# Patient Record
Sex: Male | Born: 1960 | Race: White | Hispanic: No | Marital: Married | Smoking: Never smoker
Health system: Southern US, Community
[De-identification: ages and names within clinical notes are randomized; demographics above are authoritative.]

## PROBLEM LIST (undated history)

## (undated) DIAGNOSIS — I10 Essential (primary) hypertension: Secondary | ICD-10-CM

## (undated) DIAGNOSIS — I456 Pre-excitation syndrome: Secondary | ICD-10-CM

---

## 2006-01-14 ENCOUNTER — Emergency Department (HOSPITAL_COMMUNITY): Admission: EM | Admit: 2006-01-14 | Discharge: 2006-01-14 | Payer: Self-pay | Admitting: Emergency Medicine

## 2007-11-14 ENCOUNTER — Encounter: Admission: RE | Admit: 2007-11-14 | Discharge: 2007-11-14 | Payer: Self-pay | Admitting: Orthopedic Surgery

## 2008-07-11 ENCOUNTER — Ambulatory Visit (HOSPITAL_BASED_OUTPATIENT_CLINIC_OR_DEPARTMENT_OTHER): Admission: RE | Admit: 2008-07-11 | Discharge: 2008-07-11 | Payer: Self-pay | Admitting: Family Medicine

## 2008-07-11 ENCOUNTER — Ambulatory Visit: Payer: Self-pay | Admitting: Diagnostic Radiology

## 2009-09-15 ENCOUNTER — Emergency Department (HOSPITAL_COMMUNITY): Admission: EM | Admit: 2009-09-15 | Discharge: 2009-09-15 | Payer: Self-pay | Admitting: Emergency Medicine

## 2009-09-18 ENCOUNTER — Emergency Department (HOSPITAL_COMMUNITY): Admission: EM | Admit: 2009-09-18 | Discharge: 2009-09-18 | Payer: Self-pay | Admitting: Emergency Medicine

## 2009-10-14 ENCOUNTER — Encounter: Admission: RE | Admit: 2009-10-14 | Discharge: 2009-10-14 | Payer: Self-pay | Admitting: Sports Medicine

## 2010-09-26 LAB — URINALYSIS, ROUTINE W REFLEX MICROSCOPIC
Bilirubin Urine: NEGATIVE
Hgb urine dipstick: NEGATIVE
Ketones, ur: NEGATIVE mg/dL
Protein, ur: NEGATIVE mg/dL
Specific Gravity, Urine: 1.027 (ref 1.005–1.030)

## 2010-09-26 LAB — DIFFERENTIAL
Basophils Relative: 1 % (ref 0–1)
Basophils Relative: 1 % (ref 0–1)
Eosinophils Relative: 3 % (ref 0–5)
Lymphocytes Relative: 37 % (ref 12–46)
Lymphs Abs: 1.9 10*3/uL (ref 0.7–4.0)
Monocytes Absolute: 0.3 10*3/uL (ref 0.1–1.0)
Monocytes Absolute: 0.3 10*3/uL (ref 0.1–1.0)
Monocytes Relative: 6 % (ref 3–12)
Neutro Abs: 2.7 10*3/uL (ref 1.7–7.7)
Neutrophils Relative %: 56 % (ref 43–77)

## 2010-09-26 LAB — BASIC METABOLIC PANEL
BUN: 16 mg/dL (ref 6–23)
BUN: 18 mg/dL (ref 6–23)
CO2: 25 mEq/L (ref 19–32)
CO2: 28 mEq/L (ref 19–32)
Calcium: 9.2 mg/dL (ref 8.4–10.5)
Calcium: 9.6 mg/dL (ref 8.4–10.5)
Chloride: 102 mEq/L (ref 96–112)
Creatinine, Ser: 0.89 mg/dL (ref 0.4–1.5)
Creatinine, Ser: 1.06 mg/dL (ref 0.4–1.5)
GFR calc Af Amer: >60 mL/min (ref >60)
GFR calc non Af Amer: >60 mL/min (ref >60)
Glucose, Bld: 117 mg/dL — ABNORMAL HIGH (ref 70–99)
Glucose, Bld: 126 mg/dL — ABNORMAL HIGH (ref 70–99)
Sodium: 135 mEq/L (ref 135–145)

## 2010-09-26 LAB — CBC
HCT: 47.5 % (ref 39.0–52.0)
MCHC: 32.4 g/dL (ref 30.0–36.0)
MCHC: 33.2 g/dL (ref 30.0–36.0)
MCV: 83.4 fL (ref 78.0–100.0)
Platelets: 207 10*3/uL (ref 150–400)
RBC: 5.69 MIL/uL (ref 4.22–5.81)
RDW: 14.3 % (ref 11.5–15.5)
RDW: 14.4 % (ref 11.5–15.5)
WBC: 5.1 10*3/uL (ref 4.0–10.5)

## 2010-12-06 ENCOUNTER — Inpatient Hospital Stay (INDEPENDENT_AMBULATORY_CARE_PROVIDER_SITE_OTHER)
Admission: RE | Admit: 2010-12-06 | Discharge: 2010-12-06 | Disposition: A | Discharge status: Home / Self Care | Payer: 59 | Source: Ambulatory Visit | Attending: Emergency Medicine | Admitting: Emergency Medicine

## 2010-12-06 DIAGNOSIS — S335XXA Sprain of ligaments of lumbar spine, initial encounter: Secondary | ICD-10-CM

## 2010-12-06 LAB — POCT URINALYSIS DIP (DEVICE)
Bilirubin Urine: NEGATIVE
Ketones, ur: NEGATIVE mg/dL

## 2010-12-09 ENCOUNTER — Emergency Department (HOSPITAL_COMMUNITY): Payer: 59

## 2010-12-09 ENCOUNTER — Emergency Department (HOSPITAL_COMMUNITY)
Admission: EM | Admit: 2010-12-09 | Discharge: 2010-12-09 | Disposition: A | Discharge status: Home / Self Care | Payer: 59 | Attending: Emergency Medicine | Admitting: Emergency Medicine

## 2010-12-09 DIAGNOSIS — M545 Low back pain, unspecified: Secondary | ICD-10-CM | POA: Insufficient documentation

## 2010-12-09 DIAGNOSIS — IMO0002 Reserved for concepts with insufficient information to code with codable children: Secondary | ICD-10-CM | POA: Insufficient documentation

## 2010-12-09 DIAGNOSIS — M533 Sacrococcygeal disorders, not elsewhere classified: Secondary | ICD-10-CM | POA: Insufficient documentation

## 2010-12-09 DIAGNOSIS — M5137 Other intervertebral disc degeneration, lumbosacral region: Secondary | ICD-10-CM | POA: Insufficient documentation

## 2010-12-09 DIAGNOSIS — I10 Essential (primary) hypertension: Secondary | ICD-10-CM | POA: Insufficient documentation

## 2010-12-09 DIAGNOSIS — M51379 Other intervertebral disc degeneration, lumbosacral region without mention of lumbar back pain or lower extremity pain: Secondary | ICD-10-CM | POA: Insufficient documentation

## 2010-12-09 DIAGNOSIS — R109 Unspecified abdominal pain: Secondary | ICD-10-CM | POA: Insufficient documentation

## 2010-12-09 DIAGNOSIS — I456 Pre-excitation syndrome: Secondary | ICD-10-CM | POA: Insufficient documentation

## 2010-12-09 DIAGNOSIS — Z79899 Other long term (current) drug therapy: Secondary | ICD-10-CM | POA: Insufficient documentation

## 2010-12-09 LAB — URINALYSIS, ROUTINE W REFLEX MICROSCOPIC
Hgb urine dipstick: NEGATIVE
Protein, ur: NEGATIVE mg/dL
Specific Gravity, Urine: 1.018 (ref 1.005–1.030)
Urobilinogen, UA: 1 mg/dL (ref 0.0–1.0)
pH: 6.5 (ref 5.0–8.0)

## 2010-12-12 ENCOUNTER — Encounter (HOSPITAL_COMMUNITY): Payer: Self-pay

## 2010-12-12 ENCOUNTER — Emergency Department (HOSPITAL_COMMUNITY)
Admission: EM | Admit: 2010-12-12 | Discharge: 2010-12-12 | Disposition: A | Discharge status: Home / Self Care | Payer: 59 | Attending: Emergency Medicine | Admitting: Emergency Medicine

## 2010-12-12 ENCOUNTER — Emergency Department (HOSPITAL_COMMUNITY): Payer: 59

## 2010-12-12 DIAGNOSIS — R109 Unspecified abdominal pain: Secondary | ICD-10-CM | POA: Insufficient documentation

## 2010-12-12 DIAGNOSIS — I10 Essential (primary) hypertension: Secondary | ICD-10-CM | POA: Insufficient documentation

## 2010-12-12 DIAGNOSIS — K5900 Constipation, unspecified: Secondary | ICD-10-CM | POA: Insufficient documentation

## 2010-12-12 DIAGNOSIS — M549 Dorsalgia, unspecified: Secondary | ICD-10-CM | POA: Insufficient documentation

## 2010-12-12 DIAGNOSIS — Z87442 Personal history of urinary calculi: Secondary | ICD-10-CM | POA: Insufficient documentation

## 2010-12-12 LAB — URINALYSIS, ROUTINE W REFLEX MICROSCOPIC
Bilirubin Urine: NEGATIVE
Glucose, UA: NEGATIVE mg/dL
Leukocytes, UA: NEGATIVE
Nitrite: NEGATIVE
Protein, ur: NEGATIVE mg/dL
Specific Gravity, Urine: 1.026 (ref 1.005–1.030)
Urobilinogen, UA: 0.2 mg/dL (ref 0.0–1.0)
pH: 6 (ref 5.0–8.0)

## 2010-12-15 ENCOUNTER — Inpatient Hospital Stay (INDEPENDENT_AMBULATORY_CARE_PROVIDER_SITE_OTHER)
Admission: RE | Admit: 2010-12-15 | Discharge: 2010-12-15 | Disposition: A | Discharge status: Home / Self Care | Payer: 59 | Source: Ambulatory Visit | Attending: Family Medicine | Admitting: Family Medicine

## 2010-12-15 DIAGNOSIS — M549 Dorsalgia, unspecified: Secondary | ICD-10-CM

## 2010-12-15 LAB — POCT URINALYSIS DIP (DEVICE)
Glucose, UA: NEGATIVE mg/dL
Protein, ur: 30 mg/dL — AB
pH: 5.5 (ref 5.0–8.0)

## 2011-01-10 ENCOUNTER — Other Ambulatory Visit (HOSPITAL_COMMUNITY): Payer: Self-pay | Admitting: Orthopedic Surgery

## 2011-01-10 DIAGNOSIS — M5020 Other cervical disc displacement, unspecified cervical region: Secondary | ICD-10-CM

## 2011-01-11 ENCOUNTER — Ambulatory Visit (HOSPITAL_COMMUNITY)
Admission: RE | Admit: 2011-01-11 | Discharge: 2011-01-11 | Disposition: A | Discharge status: Home / Self Care | Payer: 59 | Source: Ambulatory Visit | Attending: Orthopedic Surgery | Admitting: Orthopedic Surgery

## 2011-01-11 DIAGNOSIS — M542 Cervicalgia: Secondary | ICD-10-CM | POA: Insufficient documentation

## 2011-01-11 DIAGNOSIS — M79609 Pain in unspecified limb: Secondary | ICD-10-CM | POA: Insufficient documentation

## 2011-01-11 DIAGNOSIS — M5020 Other cervical disc displacement, unspecified cervical region: Secondary | ICD-10-CM | POA: Insufficient documentation

## 2011-01-11 DIAGNOSIS — M129 Arthropathy, unspecified: Secondary | ICD-10-CM | POA: Insufficient documentation

## 2013-03-29 IMAGING — CR DG LUMBAR SPINE COMPLETE 4+V
5 series · 5 of 5 positions shown · non-contrast
Comparison: 10/14/2009

CLINICAL DATA: Back pain after massage on [REDACTED].

LUMBAR SPINE - COMPLETE 4+ VIEW

[t l-spine a.p.]
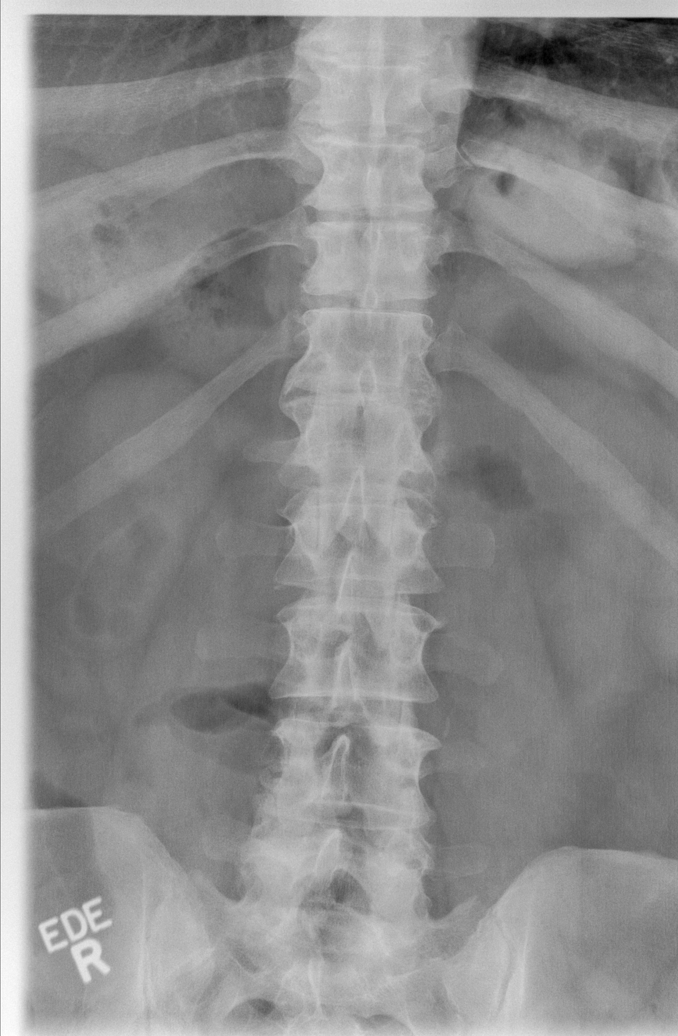

[t l-spine oblique exposure (1 of 2)]
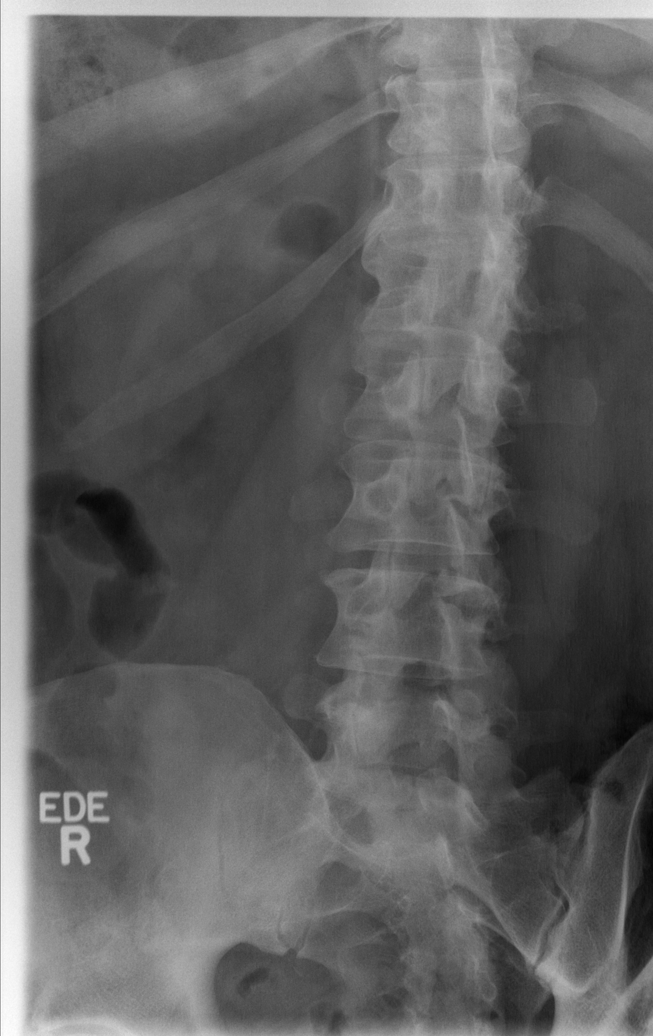

[t l-spine oblique exposure (2 of 2)]
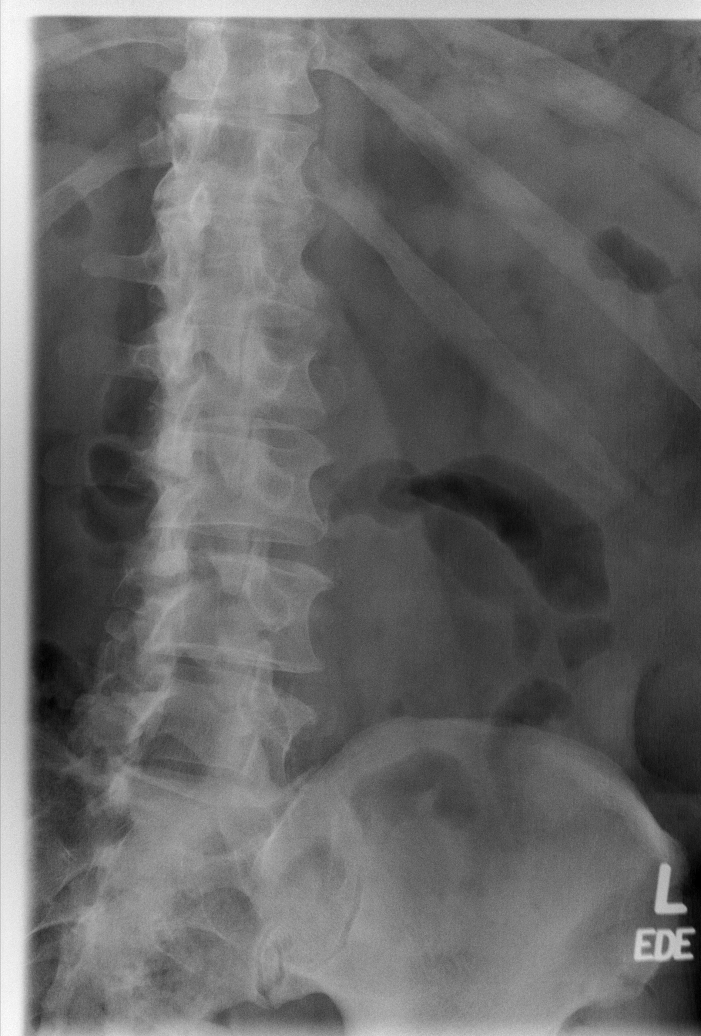

[t l-spine lat]
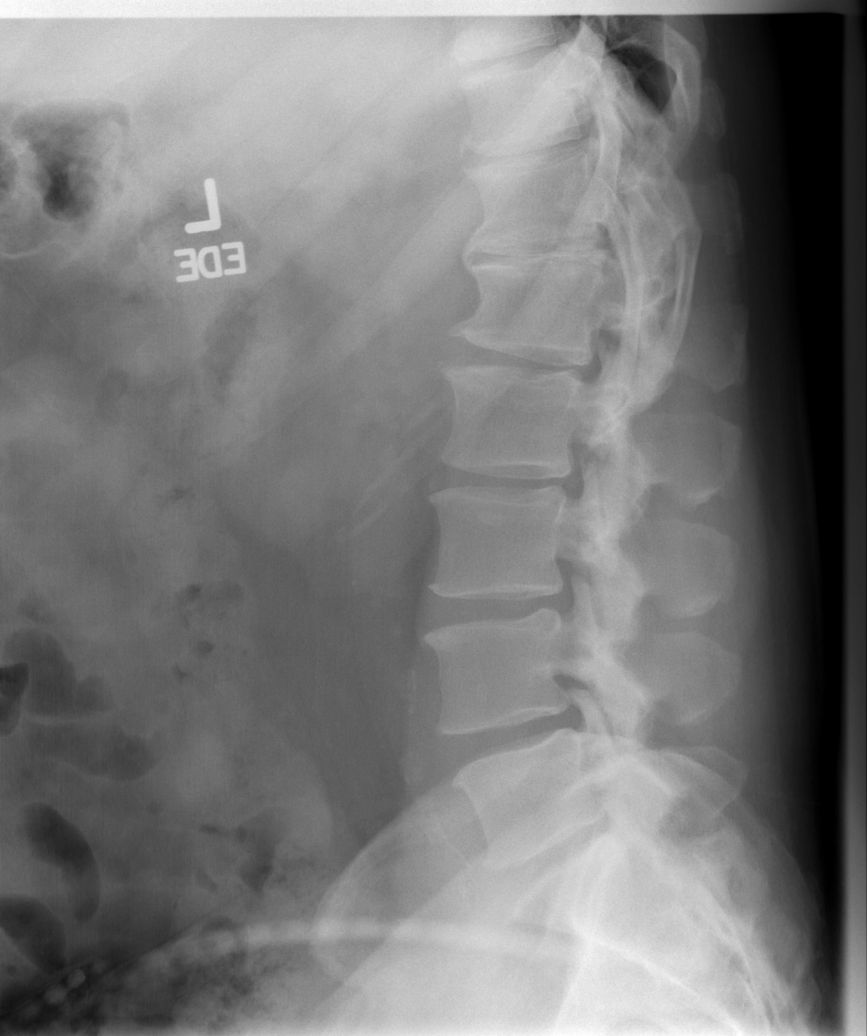

[t l-spine l5-s1 spot]
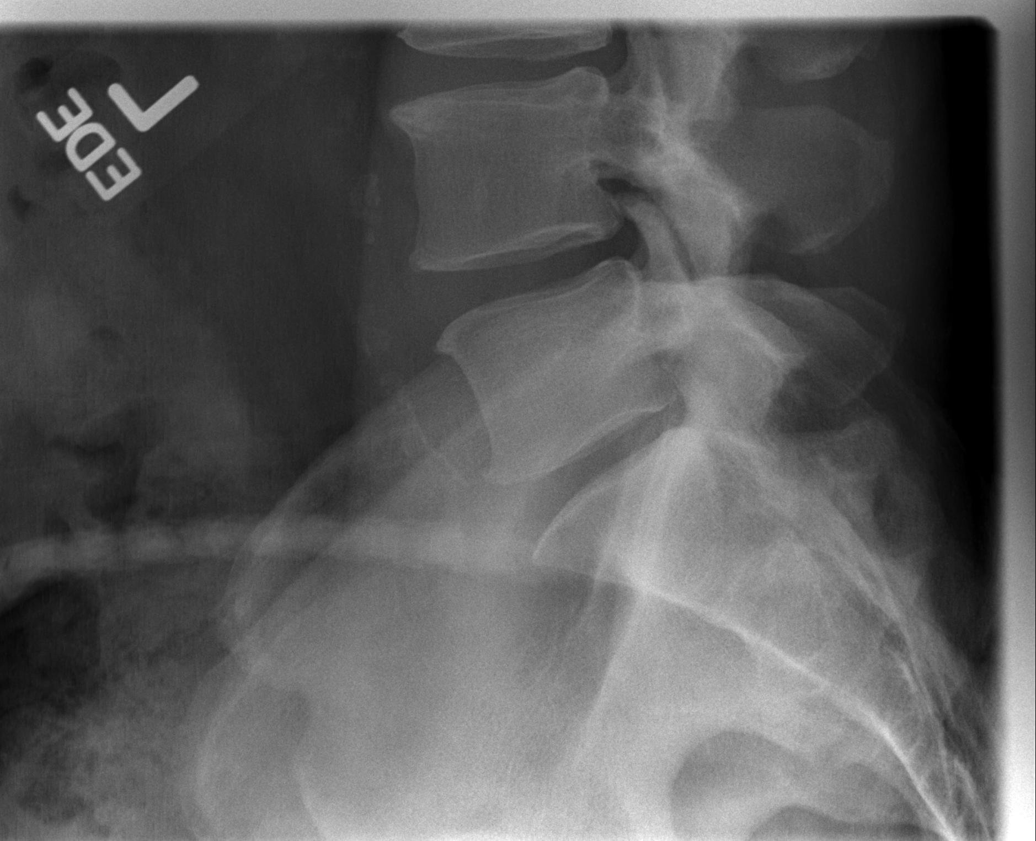

[5 of 5 positions shown; findings below may reference images not displayed]

FINDINGS: Mild multilevel degenerative changes, most pronounced at
T12-L1.  Minimal height loss at L1 appears chronic.  No acute
fracture or dislocation.  The overlying soft tissues are
unremarkable.  Scattered aortobi-iliac calcifications noted.
Nonobstructive bowel gas pattern.
IMPRESSION: Mild multilevel degenerative changes.  No acute abnormality
identified radiographically.

## 2016-01-06 ENCOUNTER — Emergency Department (INDEPENDENT_AMBULATORY_CARE_PROVIDER_SITE_OTHER)
Admission: EM | Admit: 2016-01-06 | Discharge: 2016-01-06 | Disposition: A | Discharge status: Home / Self Care | Payer: PRIVATE HEALTH INSURANCE | Source: Home / Self Care | Attending: Family Medicine | Admitting: Family Medicine

## 2016-01-06 ENCOUNTER — Encounter: Payer: Self-pay | Admitting: *Deleted

## 2016-01-06 DIAGNOSIS — H65191 Other acute nonsuppurative otitis media, right ear: Secondary | ICD-10-CM | POA: Diagnosis not present

## 2016-01-06 DIAGNOSIS — R51 Headache: Secondary | ICD-10-CM

## 2016-01-06 DIAGNOSIS — H9201 Otalgia, right ear: Secondary | ICD-10-CM | POA: Diagnosis not present

## 2016-01-06 DIAGNOSIS — R519 Headache, unspecified: Secondary | ICD-10-CM

## 2016-01-06 HISTORY — DX: Essential (primary) hypertension: I10

## 2016-01-06 HISTORY — DX: Pre-excitation syndrome: I45.6

## 2016-01-06 MED ORDER — PREDNISONE 20 MG PO TABS: ORAL_TABLET | ORAL | Status: AC

## 2016-01-06 MED ORDER — CEFDINIR 300 MG PO CAPS: 300.0000 mg | ORAL_CAPSULE | Freq: Two times a day (BID) | ORAL | Status: AC

## 2016-01-06 NOTE — ED Provider Notes (Signed)
CSN: [id number]     Arrival date & time 01/06/16  0825 History   First MD Initiated Contact with Patient 01/06/16 [phone]     Chief Complaint  Patient presents with  . Otalgia   (Consider location/radiation/quality/duration/timing/severity/associated sxs/prior Treatment) HPI  Zachary Owen is a 55 y.o. male presenting to UC with c/o Right ear pain and jaw pain for about 4 days.  Pain is aching and throbbing, 8/10 at worst.  He was on Amoxicillin about 5 weeks ago for a dental infection on the Left side, that pain has since resoled.  Ht was then started on and completed a course of Augmentin last week for a sinus infection but Right ear pain has continued to worsen.  He has also taken Claritin and used Flonase due to swelling and pressure in his sinuses.  He has taken ibuprofen and acetaminophen with minimal relief. Denies fever, chills, n/v/d. Denies cough or chest congestion.  No medication taken this morning.  Pt is visiting family from out of town but plans to drive back home to [place] today.    Past Medical History  Diagnosis Date  . WPW (Wolff-Parkinson-White syndrome)   . Hypertension    History reviewed. No pertinent past surgical history. Family History  Problem Relation Age of Onset  . Heart disease Mother   . Cancer Mother    Social History  Substance Use Topics  . Smoking status: Never Smoker   . Smokeless tobacco: None  . Alcohol Use: No    Review of Systems  Constitutional: Negative for fever and chills.  HENT: Positive for congestion, dental problem (Right side jaw pain. ), ear pain (Right) and sinus pressure. Negative for rhinorrhea, sore throat, trouble swallowing and voice change.   Respiratory: Positive for cough. Negative for shortness of breath.   Cardiovascular: Negative for chest pain and palpitations.  Gastrointestinal: Negative for nausea, vomiting and diarrhea.  Musculoskeletal: Negative for myalgias, arthralgias, neck pain and neck stiffness.  Skin:  Negative for rash.  Neurological: Negative for dizziness, light-headedness and headaches.    Allergies  Review of patient's allergies indicates no known allergies.  Home Medications   Prior to Admission medications   Medication Sig Start Date End Date Taking? Authorizing Provider  ALPRAZolam Prudy Feeler(XANAX) 1 MG tablet Take 1 mg by mouth at bedtime as needed for anxiety.   Yes Historical Provider, MD  lisinopril-hydrochlorothiazide (PRINZIDE,ZESTORETIC) 20-25 MG tablet Take 1 tablet by mouth daily.   Yes Historical Provider, MD  cefdinir (OMNICEF) 300 MG capsule Take 1 capsule (300 mg total) by mouth 2 (two) times daily. For 10 days 01/06/16   Junius FinnerErin O'Malley, PA-C  predniSONE (DELTASONE) 20 MG tablet 3 tabs po day one, then 2 po daily x 4 days 01/06/16   Junius FinnerErin O'Malley, PA-C   Meds Ordered and Administered this Visit  Medications - No data to display  BP 132/90 mmHg  Pulse 72  Temp(Src) 98.2 F (36.8 C) (Oral)  Resp 18  Ht 6\' 2"  (1.88 m)  Wt 258 lb (117.028 kg)  BMI 33.11 kg/m2  SpO2 98% No data found.   Physical Exam  Constitutional: He is oriented to person, place, and time. He appears well-developed and well-nourished.  HENT:  Head: Normocephalic and atraumatic.  Right Ear: Tympanic membrane normal. Tympanic membrane is not erythematous and not bulging.  Left Ear: Tympanic membrane normal.  Nose: Mucosal edema present. Right sinus exhibits no maxillary sinus tenderness and no frontal sinus tenderness. Left sinus exhibits no maxillary sinus tenderness and  no frontal sinus tenderness.  Mouth/Throat: Uvula is midline, oropharynx is clear and moist and mucous membranes are normal. No trismus in the jaw.  Eyes: EOM are normal.  Neck: Normal range of motion. Neck supple.  Cardiovascular: Normal rate, regular rhythm and normal heart sounds.   Pulmonary/Chest: Effort normal and breath sounds normal. No stridor. No respiratory distress. He has no wheezes. He has no rales.  Musculoskeletal:  Normal range of motion.  Lymphadenopathy:    He has no cervical adenopathy.  Neurological: He is alert and oriented to person, place, and time.  Skin: Skin is warm and dry.  Psychiatric: He has a normal mood and affect. His behavior is normal.  Nursing note and vitals reviewed.   ED Course  Procedures (including critical care time)  Labs Review Labs Reviewed - No data to display  Imaging Review No results found.  Tympanometry: Left ear- NORMAL. Right ear- POSITIVE tympanometric Peak pressure    MDM   1. Right ear pain   2. Right-sided face pain   3. Acute nonsuppurative otitis media of right ear    Pt c/o Right ear pain and jaw pain.  No dental pain noted on exam.  Tympanometry c/w AOM in Right ear.  Will have pt start on prednisone, then may fill antibiotic for Cefdinir if pain not improving in 3-4 days or symptoms continue to worsen- increased pain, fever.  Rx: Prednisone and Cefdinir.  May have acetaminophen and ibuprofen as needed for pain.       Junius Finnerrin O'Malley, PA-C 01/06/16 1016

## 2016-01-06 NOTE — ED Notes (Signed)
Pt c/o RT ear/jaw pain x 4 days. He also reports some sinus pressure. He reports taking Amoxicillin 5 wks ago for dental infection. He also completed Augmentin last wk for sinusitis. He has taken IBF/Tylenol with minimal relief.

## 2016-01-06 NOTE — Discharge Instructions (Signed)
You may take 400-600mg  Ibuprofen (Motrin) every 6-8 hours for fever and pain  Alternate with Tylenol  You may take 500mg  Tylenol every 4-6 hours as needed for fever and pain  Follow-up with your primary care provider next week for recheck of symptoms if not improving.  Be sure to drink plenty of fluids and rest, at least 8hrs of sleep a night, preferably more while you are sick. Return urgent care or go to closest ER if you cannot keep down fluids/signs of dehydration, fever not reducing with Tylenol, difficulty breathing/wheezing, stiff neck, worsening condition, or other concerns (see below)  Please take antibiotics as prescribed and be sure to complete entire course even if you start to feel better to ensure infection does not come back.  You have been prescribed 5 days of prednisone, an oral steroid to help with inflammation and itching.  You may start this medication with breakfast as it can make it difficult to sleep if taken at night.
